# Patient Record
Sex: Male | Born: 1960 | Hispanic: No | Marital: Married | Smoking: Former smoker
Health system: Southern US, Community
[De-identification: ages and names within clinical notes are randomized; demographics above are authoritative.]

## PROBLEM LIST (undated history)

## (undated) HISTORY — PX: KNEE SURGERY: SHX244

## (undated) HISTORY — PX: TONSILLECTOMY: SUR1361

## (undated) HISTORY — PX: CHOLECYSTECTOMY: SHX55

---

## 1998-03-26 ENCOUNTER — Emergency Department (HOSPITAL_COMMUNITY): Admission: EM | Admit: 1998-03-26 | Discharge: 1998-03-26 | Payer: Self-pay | Admitting: Emergency Medicine

## 2005-11-27 ENCOUNTER — Emergency Department (HOSPITAL_COMMUNITY): Admission: EM | Admit: 2005-11-27 | Discharge: 2005-11-27 | Payer: Self-pay | Admitting: Emergency Medicine

## 2006-01-07 ENCOUNTER — Ambulatory Visit (HOSPITAL_COMMUNITY): Admission: RE | Admit: 2006-01-07 | Discharge: 2006-01-07 | Payer: Self-pay | Admitting: Surgery

## 2010-12-28 ENCOUNTER — Emergency Department (HOSPITAL_COMMUNITY)
Admission: EM | Admit: 2010-12-28 | Discharge: 2010-12-28 | Disposition: A | Discharge status: Home / Self Care | Payer: BC Managed Care – PPO | Attending: Emergency Medicine | Admitting: Emergency Medicine

## 2010-12-28 ENCOUNTER — Emergency Department (HOSPITAL_COMMUNITY): Payer: BC Managed Care – PPO

## 2010-12-28 DIAGNOSIS — S61409A Unspecified open wound of unspecified hand, initial encounter: Secondary | ICD-10-CM | POA: Insufficient documentation

## 2010-12-28 DIAGNOSIS — W261XXA Contact with sword or dagger, initial encounter: Secondary | ICD-10-CM | POA: Insufficient documentation

## 2010-12-28 DIAGNOSIS — IMO0002 Reserved for concepts with insufficient information to code with codable children: Secondary | ICD-10-CM

## 2010-12-28 DIAGNOSIS — W260XXA Contact with knife, initial encounter: Secondary | ICD-10-CM | POA: Insufficient documentation

## 2010-12-28 MED ORDER — DOXYCYCLINE HYCLATE 100 MG PO CAPS: 100.0000 mg | ORAL_CAPSULE | Freq: Two times a day (BID) | ORAL | Status: AC

## 2010-12-28 MED ORDER — LIDOCAINE HCL 2 % IJ SOLN
INTRAMUSCULAR | Status: AC
  Administered 2010-12-28: 18:00:00
  Filled 2010-12-28: qty 1

## 2010-12-28 MED ORDER — OXYCODONE-ACETAMINOPHEN 5-325 MG PO TABS: 1.0000 | ORAL_TABLET | Freq: Four times a day (QID) | ORAL | Status: AC | PRN

## 2010-12-28 MED ORDER — LIDOCAINE HCL (PF) 1 % IJ SOLN
5.0000 mL | Freq: Once | INTRAMUSCULAR | Status: DC
  Filled 2010-12-28: qty 5

## 2010-12-28 NOTE — ED Provider Notes (Signed)
Medical screening examination/treatment/procedure(s) were performed by non-physician practitioner and as supervising physician I was immediately available for consultation/collaboration.   Joya Gaskins, MD 12/28/10 [phone]

## 2010-12-28 NOTE — ED Notes (Signed)
Last tetnus shot 2 years ago

## 2010-12-28 NOTE — ED Notes (Signed)
Pt reports cut left hand middle finger with table saw- cap refill <2 sec.  Pt with good CMS

## 2010-12-28 NOTE — ED Provider Notes (Signed)
History     CSN: [id number] Arrival date & time: 12/28/2010  3:55 PM   First MD Initiated Contact with Patient 12/28/10 1641      Chief Complaint  Patient presents with  . Laceration    (Consider location/radiation/quality/duration/timing/severity/associated sxs/prior treatment) Patient is a 50 y.o. male presenting with skin laceration. The history is provided by the patient.  Laceration  The incident occurred 3 to 5 hours ago. The laceration is located on the left hand. The laceration is 1 cm in size. The laceration mechanism was a a dirty knife. The pain is at a severity of 7/10. The pain is moderate. The pain has been constant since onset. He reports no foreign bodies present. His tetanus status is UTD.    Pt was cutting with a table saw and injured his finger on the left hand.   History reviewed. No pertinent past medical history.  History reviewed. No pertinent past surgical history.  History reviewed. No pertinent family history.  History  Substance Use Topics  . Smoking status: Former Games developer  . Smokeless tobacco: Not on file  . Alcohol Use: Yes      Review of Systems  All other systems reviewed and are negative.    Allergies  Review of patient's allergies indicates no known allergies.  Home Medications  No current outpatient prescriptions on file.  BP 150/94  Pulse 70  Temp(Src) 97.9 F (36.6 C) (Oral)  Resp 20  SpO2 97%  Physical Exam  Constitutional: He is oriented to person, place, and time. He appears well-developed and well-nourished.  HENT:  Head: Normocephalic and atraumatic.  Eyes: EOM are normal. Pupils are equal, round, and reactive to light.  Neck: Normal range of motion.  Cardiovascular: Normal rate and regular rhythm.   Pulmonary/Chest: Effort normal and breath sounds normal.  Musculoskeletal: Normal range of motion.       Hands: Neurological: He is alert and oriented to person, place, and time.  Skin: Skin is warm and dry.     ED Course  LACERATION REPAIR Date/Time: 12/28/2010 6:30 PM Performed by: Dorthula Matas Authorized by: Dorthula Matas Consent: Verbal consent obtained. Consent given by: patient Patient understanding: patient states understanding of the procedure being performed Patient consent: the patient's understanding of the procedure matches consent given Procedure consent: procedure consent matches procedure scheduled Patient identity confirmed: verbally with patient Body area: upper extremity Location details: left long finger Laceration length: 1.5 cm Foreign bodies: metal Tendon involvement: none Nerve involvement: none Vascular damage: no Anesthesia: local infiltration Patient sedated: no Irrigation solution: pt soaked finger in iodine for 30 minutes. Skin closure: 6-0 Prolene Number of sutures: 2 Technique: simple Approximation: close Approximation difficulty: complex Dressing: antibiotic ointment Patient tolerance: Patient tolerated the procedure well with no immediate complications.   (including critical care time)  Labs Reviewed - No data to display No results found.   No diagnosis found.    MDM          Dorthula Matas, PA 12/28/10 [phone]

## 2013-05-10 IMAGING — CR DG FINGER MIDDLE 2+V*L*
3 series · 3 of 3 positions shown · non-contrast
Comparison: None.

CLINICAL DATA: Laceration to tip of finger.

LEFT MIDDLE FINGER 2+V

[x finger pa left]
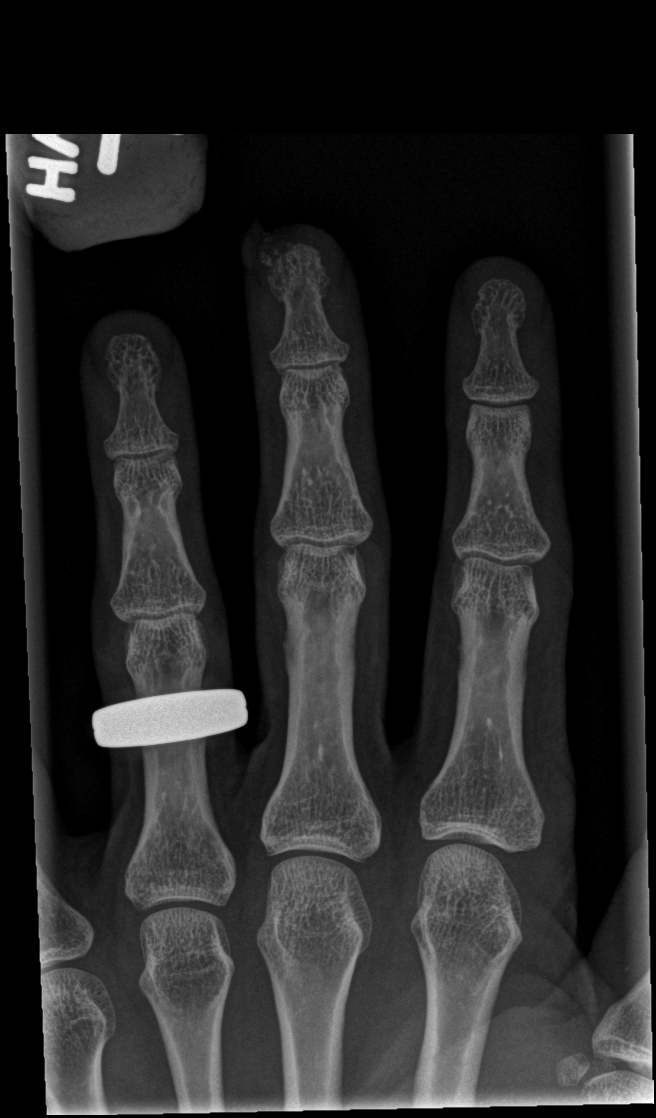

[x finger obl left]
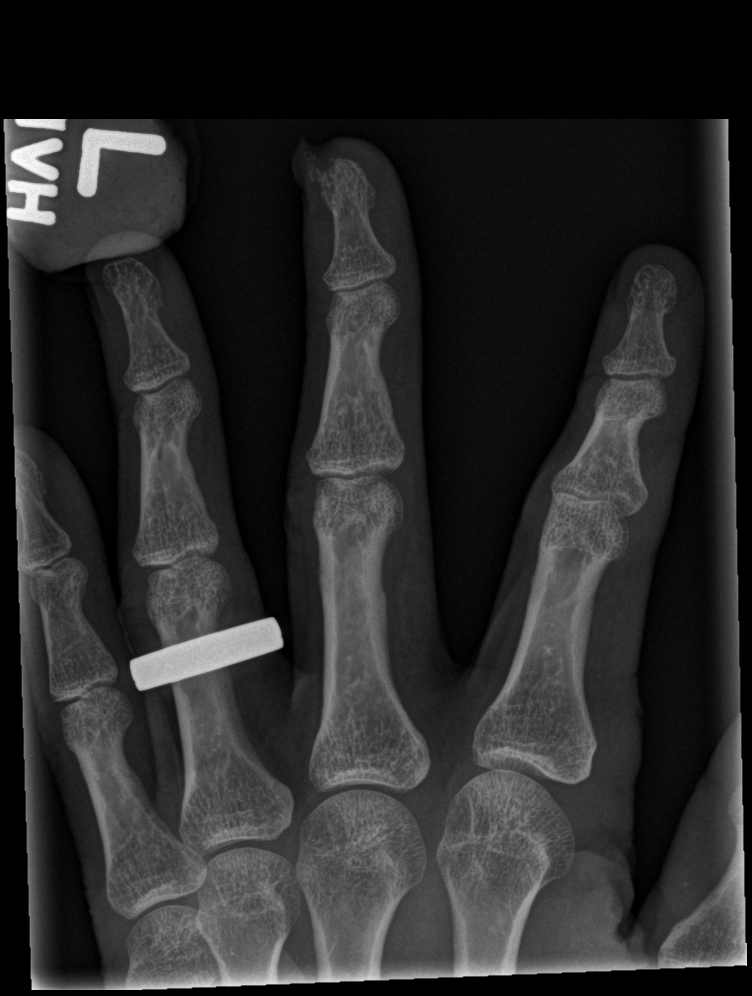

[x finger lat left]
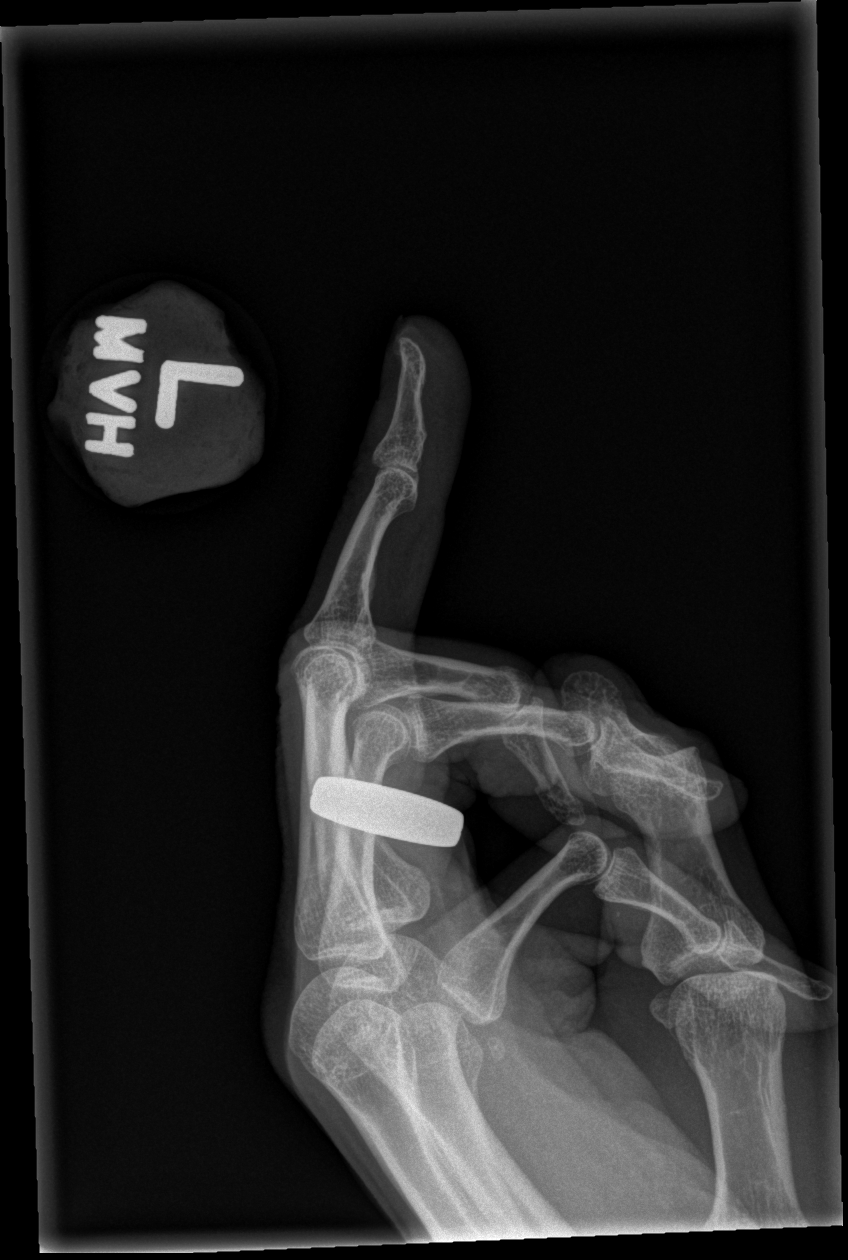

[3 of 3 positions shown; findings below may reference images not displayed]

FINDINGS: Tuft fracture/partial amputation of the tip of the third
digit.  No intra-articular extension. No radio-opaque foreign body.
IMPRESSION: Tuft fracture/partial amputation of the tip of the third digit.

## 2014-10-18 ENCOUNTER — Emergency Department (HOSPITAL_COMMUNITY)
Admission: EM | Admit: 2014-10-18 | Discharge: 2014-10-18 | Disposition: A | Discharge status: Home / Self Care | Payer: BLUE CROSS/BLUE SHIELD | Attending: Emergency Medicine | Admitting: Emergency Medicine

## 2014-10-18 ENCOUNTER — Encounter (HOSPITAL_COMMUNITY): Payer: Self-pay | Admitting: Emergency Medicine

## 2014-10-18 DIAGNOSIS — Y998 Other external cause status: Secondary | ICD-10-CM | POA: Diagnosis not present

## 2014-10-18 DIAGNOSIS — Y289XXA Contact with unspecified sharp object, undetermined intent, initial encounter: Secondary | ICD-10-CM | POA: Insufficient documentation

## 2014-10-18 DIAGNOSIS — S41111A Laceration without foreign body of right upper arm, initial encounter: Secondary | ICD-10-CM | POA: Insufficient documentation

## 2014-10-18 DIAGNOSIS — Y9339 Activity, other involving climbing, rappelling and jumping off: Secondary | ICD-10-CM | POA: Diagnosis not present

## 2014-10-18 DIAGNOSIS — Z87891 Personal history of nicotine dependence: Secondary | ICD-10-CM | POA: Diagnosis not present

## 2014-10-18 DIAGNOSIS — Y9289 Other specified places as the place of occurrence of the external cause: Secondary | ICD-10-CM | POA: Insufficient documentation

## 2014-10-18 DIAGNOSIS — S59911A Unspecified injury of right forearm, initial encounter: Secondary | ICD-10-CM | POA: Diagnosis present

## 2014-10-18 DIAGNOSIS — Z23 Encounter for immunization: Secondary | ICD-10-CM | POA: Insufficient documentation

## 2014-10-18 MED ORDER — TETANUS-DIPHTH-ACELL PERTUSSIS 5-2.5-18.5 LF-MCG/0.5 IM SUSP
0.5000 mL | Freq: Once | INTRAMUSCULAR | Status: AC
  Administered 2014-10-18: 0.5 mL via INTRAMUSCULAR
  Filled 2014-10-18: qty 0.5

## 2014-10-18 NOTE — ED Notes (Signed)
Pt states he was climbing ladder and cut arm on shelf brackets. Approximately 2" laceration to right forearm, bleeding controlled, clean dressing in place upon arrival.

## 2014-10-18 NOTE — ED Notes (Signed)
AVS explained in detail. [facility]. Given laceration care instructions. No other c/c.

## 2014-10-18 NOTE — Discharge Instructions (Signed)

## 2014-10-18 NOTE — ED Provider Notes (Signed)
CSN: [id number]     Arrival date & time 10/18/14  1316 History  This chart was scribed for non-physician practitioner, Everlene Farrier, PA-C working with Leta Baptist, MD by Placido Sou, ED scribe. This patient was seen in room WTR7/WTR7 and the patient's care was started at 3:21 PM.   Chief Complaint  Patient presents with  . Extremity Laceration   The history is provided by the patient. No language interpreter was used.    HPI Comments: Bernard Morales is a 54 y.o. male who presents to the Emergency Department complaining of two  lacerations with controlled bleeding to his right forearm with onset earlier today. Pt notes that he was climbing a ladder and cut his arm across a metal bracket. Pt is unsure of his last tetanus vaccination. He denies any numbness, tingling or weakness.   History reviewed. No pertinent past medical history. Past Surgical History  Procedure Laterality Date  . Knee surgery    . Cholecystectomy    . Tonsillectomy     No family history on file. Social History  Substance Use Topics  . Smoking status: Former Games developer  . Smokeless tobacco: None  . Alcohol Use: Yes    Review of Systems  Constitutional: Negative for fever.  Skin: Positive for wound. Negative for color change.  Neurological: Negative for weakness and numbness.   Allergies  Review of patient's allergies indicates no known allergies.  Home Medications   Prior to Admission medications   Not on File   BP 120/78 mmHg  Pulse 63  Temp(Src) 98.5 F (36.9 C) (Oral)  Resp 18  Ht 6\' 1"  (1.854 m)  Wt 152 lb (68.947 kg)  BMI 20.06 kg/m2  SpO2 96% Physical Exam  Constitutional: He appears well-developed and well-nourished. No distress.  HENT:  Head: Normocephalic and atraumatic.  Eyes: Right eye exhibits no discharge. Left eye exhibits no discharge.  Pulmonary/Chest: Effort normal. No respiratory distress.  Musculoskeletal: Normal range of motion. He exhibits no edema or tenderness.   Good strength and range of motion of his right hand and wrist. No forearm tenderness.  Neurological: He is alert. Coordination normal.  Skin: Skin is warm and dry. No rash noted. He is not diaphoretic. No erythema. No pallor.  1.5 cm laceration to right forearm and 1 cm laceration distal to first laceration; both are superficial dermal layer only with no tendon involvement.   Psychiatric: He has a normal mood and affect. His behavior is normal.  Nursing note and vitals reviewed.  ED Course  LACERATION REPAIR Date/Time: 10/18/2014 3:30 PM Performed by: Everlene Farrier Authorized by: Everlene Farrier Consent: Verbal consent obtained. Risks and benefits: risks, benefits and alternatives were discussed Consent given by: patient Patient understanding: patient states understanding of the procedure being performed Patient consent: the patient's understanding of the procedure matches consent given Procedure consent: procedure consent matches procedure scheduled Test results: test results available and properly labeled Site marked: the operative site was marked Required items: required blood products, implants, devices, and special equipment available Patient identity confirmed: verbally with patient Time out: Immediately prior to procedure a "time out" was called to verify the correct patient, procedure, equipment, support staff and site/side marked as required. Body area: upper extremity Location details: right lower arm Laceration length: 1.5 cm Foreign bodies: no foreign bodies Tendon involvement: none Nerve involvement: none Vascular damage: no Patient sedated: no Preparation: Patient was prepped and draped in the usual sterile fashion. Irrigation solution: saline Irrigation method: jet lavage Amount  of cleaning: standard Debridement: none Degree of undermining: none Skin closure: glue Technique: simple Approximation: close Approximation difficulty: simple Dressing: non-adhesive  packing strip Patient tolerance: Patient tolerated the procedure well with no immediate complications    DIAGNOSTIC STUDIES: Oxygen Saturation is 96% on RA, adequate by my interpretation.    COORDINATION OF CARE: 3:25 PM Discussed treatment plan with pt at bedside including a tdap booster, cleaning the affected wound and closing the lacerations with Dermabond and steri strips. Pt agreed to plan.  Labs Review Labs Reviewed - No data to display  Imaging Review No results found.    EKG Interpretation None      Filed Vitals:   10/18/14 1336 10/18/14 1338  BP: 120/78   Pulse: 63   Temp: 98.5 F (36.9 C)   TempSrc: Oral   Resp: 18   Height:   (1.854 m)  Weight:  152 lb (68.947 kg)  SpO2: 96%      MDM   Meds given in ED:  Medications  Tdap (BOOSTRIX) injection 0.5 mL (not administered)    New Prescriptions   No medications on file    Final diagnoses:  Arm laceration, right, initial encounter   This 54 year old male who presents to the emergency department with a superficial laceration to his right forearm. He denies any numbness or tingling.He denies any arm weakness. The patient has a 1.5 cm superficial dermal layer laceration to his right forearm and a 1 cm laceration distal to the first laceration. Both are well approximated. No evidence of foreign bodies. Bleeding is controlled. No evidence of tendon involvement. The patient has good strength and range of motion of his right hand and wrist. I discussed with the patient whether or not to use Dermabond or suturing. The patient elected for Dermabond closure with Steri-Strips. I agree with this plan. Patient's lacerations were cleaned by me and repaired with Dermabond and Steri-Strips. The patient tolerated procedure well. Wound care instructions given. I advised the patient to follow-up with their primary care provider this week. I advised the patient to return to the emergency department with new or worsening symptoms  or new concerns. The patient verbalized understanding and agreement with plan.    I personally performed the services described in this documentation, which was scribed in my presence. The recorded information has been reviewed and is accurate.      Everlene Farrier, PA-C 10/18/14 1544  Leta Baptist, MD 10/20/14 [phone]

## 2015-12-06 DIAGNOSIS — M0579 Rheumatoid arthritis with rheumatoid factor of multiple sites without organ or systems involvement: Secondary | ICD-10-CM | POA: Diagnosis not present

## 2016-06-04 DIAGNOSIS — Z79899 Other long term (current) drug therapy: Secondary | ICD-10-CM | POA: Diagnosis not present

## 2016-06-04 DIAGNOSIS — Z6823 Body mass index (BMI) 23.0-23.9, adult: Secondary | ICD-10-CM | POA: Diagnosis not present

## 2016-06-04 DIAGNOSIS — M0579 Rheumatoid arthritis with rheumatoid factor of multiple sites without organ or systems involvement: Secondary | ICD-10-CM | POA: Diagnosis not present

## 2016-06-04 DIAGNOSIS — M2550 Pain in unspecified joint: Secondary | ICD-10-CM | POA: Diagnosis not present

## 2016-06-13 DIAGNOSIS — M069 Rheumatoid arthritis, unspecified: Secondary | ICD-10-CM | POA: Diagnosis not present

## 2016-06-13 DIAGNOSIS — F419 Anxiety disorder, unspecified: Secondary | ICD-10-CM | POA: Diagnosis not present

## 2016-07-15 DIAGNOSIS — E7800 Pure hypercholesterolemia, unspecified: Secondary | ICD-10-CM | POA: Diagnosis not present

## 2016-07-15 DIAGNOSIS — Z0000 Encounter for general adult medical examination without abnormal findings: Secondary | ICD-10-CM | POA: Diagnosis not present

## 2016-07-15 DIAGNOSIS — S46812A Strain of other muscles, fascia and tendons at shoulder and upper arm level, left arm, initial encounter: Secondary | ICD-10-CM | POA: Diagnosis not present

## 2016-12-04 DIAGNOSIS — Z79899 Other long term (current) drug therapy: Secondary | ICD-10-CM | POA: Diagnosis not present

## 2016-12-04 DIAGNOSIS — M0579 Rheumatoid arthritis with rheumatoid factor of multiple sites without organ or systems involvement: Secondary | ICD-10-CM | POA: Diagnosis not present

## 2016-12-04 DIAGNOSIS — M2550 Pain in unspecified joint: Secondary | ICD-10-CM | POA: Diagnosis not present

## 2016-12-04 DIAGNOSIS — Z23 Encounter for immunization: Secondary | ICD-10-CM | POA: Diagnosis not present

## 2017-06-04 DIAGNOSIS — M0579 Rheumatoid arthritis with rheumatoid factor of multiple sites without organ or systems involvement: Secondary | ICD-10-CM | POA: Diagnosis not present

## 2017-06-04 DIAGNOSIS — Z79899 Other long term (current) drug therapy: Secondary | ICD-10-CM | POA: Diagnosis not present

## 2017-06-04 DIAGNOSIS — M2550 Pain in unspecified joint: Secondary | ICD-10-CM | POA: Diagnosis not present

## 2017-11-03 DIAGNOSIS — F419 Anxiety disorder, unspecified: Secondary | ICD-10-CM | POA: Diagnosis not present

## 2017-11-03 DIAGNOSIS — Z23 Encounter for immunization: Secondary | ICD-10-CM | POA: Diagnosis not present

## 2017-12-10 DIAGNOSIS — Z79899 Other long term (current) drug therapy: Secondary | ICD-10-CM | POA: Diagnosis not present

## 2017-12-10 DIAGNOSIS — M2550 Pain in unspecified joint: Secondary | ICD-10-CM | POA: Diagnosis not present

## 2017-12-10 DIAGNOSIS — M0579 Rheumatoid arthritis with rheumatoid factor of multiple sites without organ or systems involvement: Secondary | ICD-10-CM | POA: Diagnosis not present

## 2018-06-10 DIAGNOSIS — M0579 Rheumatoid arthritis with rheumatoid factor of multiple sites without organ or systems involvement: Secondary | ICD-10-CM | POA: Diagnosis not present

## 2018-06-10 DIAGNOSIS — Z79899 Other long term (current) drug therapy: Secondary | ICD-10-CM | POA: Diagnosis not present

## 2018-12-10 DIAGNOSIS — M0579 Rheumatoid arthritis with rheumatoid factor of multiple sites without organ or systems involvement: Secondary | ICD-10-CM | POA: Diagnosis not present

## 2018-12-10 DIAGNOSIS — M2550 Pain in unspecified joint: Secondary | ICD-10-CM | POA: Diagnosis not present

## 2018-12-10 DIAGNOSIS — Z23 Encounter for immunization: Secondary | ICD-10-CM | POA: Diagnosis not present

## 2019-05-26 DIAGNOSIS — M76829 Posterior tibial tendinitis, unspecified leg: Secondary | ICD-10-CM | POA: Diagnosis not present

## 2019-05-26 DIAGNOSIS — M0579 Rheumatoid arthritis with rheumatoid factor of multiple sites without organ or systems involvement: Secondary | ICD-10-CM | POA: Diagnosis not present

## 2019-05-26 DIAGNOSIS — M2550 Pain in unspecified joint: Secondary | ICD-10-CM | POA: Diagnosis not present

## 2019-05-26 DIAGNOSIS — Z79899 Other long term (current) drug therapy: Secondary | ICD-10-CM | POA: Diagnosis not present

## 2019-05-30 DIAGNOSIS — Z03818 Encounter for observation for suspected exposure to other biological agents ruled out: Secondary | ICD-10-CM | POA: Diagnosis not present

## 2019-05-30 DIAGNOSIS — Z20828 Contact with and (suspected) exposure to other viral communicable diseases: Secondary | ICD-10-CM | POA: Diagnosis not present

## 2019-08-01 DIAGNOSIS — S20219A Contusion of unspecified front wall of thorax, initial encounter: Secondary | ICD-10-CM | POA: Diagnosis not present

## 2019-11-25 DIAGNOSIS — M0579 Rheumatoid arthritis with rheumatoid factor of multiple sites without organ or systems involvement: Secondary | ICD-10-CM | POA: Diagnosis not present

## 2019-11-25 DIAGNOSIS — M2550 Pain in unspecified joint: Secondary | ICD-10-CM | POA: Diagnosis not present

## 2019-11-25 DIAGNOSIS — Z79899 Other long term (current) drug therapy: Secondary | ICD-10-CM | POA: Diagnosis not present

## 2019-11-25 DIAGNOSIS — Z111 Encounter for screening for respiratory tuberculosis: Secondary | ICD-10-CM | POA: Diagnosis not present

## 2020-01-19 DIAGNOSIS — R509 Fever, unspecified: Secondary | ICD-10-CM | POA: Diagnosis not present

## 2020-01-19 DIAGNOSIS — M545 Low back pain, unspecified: Secondary | ICD-10-CM | POA: Diagnosis not present

## 2020-01-24 DIAGNOSIS — N390 Urinary tract infection, site not specified: Secondary | ICD-10-CM | POA: Diagnosis not present

## 2020-02-01 DIAGNOSIS — L821 Other seborrheic keratosis: Secondary | ICD-10-CM | POA: Diagnosis not present

## 2020-02-01 DIAGNOSIS — D225 Melanocytic nevi of trunk: Secondary | ICD-10-CM | POA: Diagnosis not present

## 2020-02-01 DIAGNOSIS — L814 Other melanin hyperpigmentation: Secondary | ICD-10-CM | POA: Diagnosis not present

## 2020-02-01 DIAGNOSIS — D1801 Hemangioma of skin and subcutaneous tissue: Secondary | ICD-10-CM | POA: Diagnosis not present

## 2020-05-29 DIAGNOSIS — Z79899 Other long term (current) drug therapy: Secondary | ICD-10-CM | POA: Diagnosis not present

## 2020-05-29 DIAGNOSIS — M2550 Pain in unspecified joint: Secondary | ICD-10-CM | POA: Diagnosis not present

## 2020-05-29 DIAGNOSIS — M0579 Rheumatoid arthritis with rheumatoid factor of multiple sites without organ or systems involvement: Secondary | ICD-10-CM | POA: Diagnosis not present

## 2020-10-31 ENCOUNTER — Other Ambulatory Visit: Payer: Self-pay | Admitting: *Deleted

## 2020-10-31 ENCOUNTER — Other Ambulatory Visit: Payer: Self-pay

## 2020-10-31 DIAGNOSIS — Z125 Encounter for screening for malignant neoplasm of prostate: Secondary | ICD-10-CM

## 2020-10-31 NOTE — Progress Notes (Signed)
Patient: Bernard Morales           Date of Birth: [birth year]           MRN: [id number] Visit Date: 10/31/2020 PCP: No primary care provider on file.  Prostate Cancer Screening Date of last physical exam:  (2020) Date of last rectal exam:  (Never) Have you ever had any of the following?: Vasectomy, Family member with prostate cancer (Family History of father having prostate cancer) Have you ever had or been told you have an allergy to latex products?: No Are you currently taking any natural prostate preparations?: No Are you currently experiencing any urinary symptoms?: No  Prostate Exam Exam not completed. PSA Only.  Patient's History There are no problems to display for this patient.  No past medical history on file.  No family history on file.  Social History   Occupational History   Not on file  Tobacco Use   Smoking status: Former   Smokeless tobacco: Not on file  Substance and Sexual Activity   Alcohol use: Yes   Drug use: No   Sexual activity: Not on file

## 2020-11-01 LAB — PSA: Prostate Specific Ag, Serum: 2.8 ng/mL (ref 0.0–4.0)

## 2020-11-12 ENCOUNTER — Telehealth: Payer: Self-pay

## 2020-11-12 NOTE — Telephone Encounter (Signed)
Normal PSA result letter mailed.  November 12, 2020         Dear Mr. Bernard Morales,   Thank you for your participation in the Prostate Cancer Screening at [facility] on October 31, 2020.   The result of your blood test for the level of the Prostate Specific Antigen (PSA) was found to be normal. It is recommended that continue yearly screening and share these results with your physician.  If you have any questions about these results, please call Fonnie Mu at [phone] or Geraldin Habermehl at [phone].  Sincerely,     Fonnie Mu, MSN, RN,OCN Oncology Outreach Manager [facility]   Clois Dupes, LPN Oncology Outreach  [facility]

## 2020-12-04 DIAGNOSIS — M0579 Rheumatoid arthritis with rheumatoid factor of multiple sites without organ or systems involvement: Secondary | ICD-10-CM | POA: Diagnosis not present

## 2020-12-04 DIAGNOSIS — Z79899 Other long term (current) drug therapy: Secondary | ICD-10-CM | POA: Diagnosis not present

## 2020-12-04 DIAGNOSIS — R5383 Other fatigue: Secondary | ICD-10-CM | POA: Diagnosis not present

## 2020-12-04 DIAGNOSIS — M25562 Pain in left knee: Secondary | ICD-10-CM | POA: Diagnosis not present

## 2021-06-10 DIAGNOSIS — Z79899 Other long term (current) drug therapy: Secondary | ICD-10-CM | POA: Diagnosis not present

## 2021-06-10 DIAGNOSIS — M0579 Rheumatoid arthritis with rheumatoid factor of multiple sites without organ or systems involvement: Secondary | ICD-10-CM | POA: Diagnosis not present

## 2021-08-13 ENCOUNTER — Encounter (HOSPITAL_COMMUNITY): Payer: Self-pay

## 2021-08-13 ENCOUNTER — Other Ambulatory Visit: Payer: Self-pay

## 2021-08-13 ENCOUNTER — Emergency Department (HOSPITAL_COMMUNITY): Payer: BC Managed Care – PPO

## 2021-08-13 ENCOUNTER — Emergency Department (HOSPITAL_COMMUNITY)
Admission: EM | Admit: 2021-08-13 | Discharge: 2021-08-13 | Discharge status: Against Medical Advice | Payer: BC Managed Care – PPO | Attending: Emergency Medicine | Admitting: Emergency Medicine

## 2021-08-13 DIAGNOSIS — M25512 Pain in left shoulder: Secondary | ICD-10-CM | POA: Insufficient documentation

## 2021-08-13 DIAGNOSIS — R200 Anesthesia of skin: Secondary | ICD-10-CM | POA: Diagnosis not present

## 2021-08-13 DIAGNOSIS — M25519 Pain in unspecified shoulder: Secondary | ICD-10-CM | POA: Diagnosis not present

## 2021-08-13 DIAGNOSIS — Z5321 Procedure and treatment not carried out due to patient leaving prior to being seen by health care provider: Secondary | ICD-10-CM | POA: Insufficient documentation

## 2021-08-13 DIAGNOSIS — R0789 Other chest pain: Secondary | ICD-10-CM | POA: Diagnosis not present

## 2021-08-13 DIAGNOSIS — R202 Paresthesia of skin: Secondary | ICD-10-CM | POA: Insufficient documentation

## 2021-08-13 DIAGNOSIS — R079 Chest pain, unspecified: Secondary | ICD-10-CM | POA: Insufficient documentation

## 2021-08-13 DIAGNOSIS — F17290 Nicotine dependence, other tobacco product, uncomplicated: Secondary | ICD-10-CM | POA: Diagnosis not present

## 2021-08-13 LAB — BASIC METABOLIC PANEL
Anion gap: 13 (ref 5–15)
BUN: 13 mg/dL (ref 8–23)
CO2: 22 mmol/L (ref 22–32)
Calcium: 9.3 mg/dL (ref 8.9–10.3)
Chloride: 102 mmol/L (ref 98–111)
Creatinine, Ser: 0.79 mg/dL (ref 0.61–1.24)
GFR, Estimated: >60 mL/min (ref >60)
Glucose, Bld: 91 mg/dL (ref 70–99)
Potassium: 4 mmol/L (ref 3.5–5.1)
Sodium: 137 mmol/L (ref 135–145)

## 2021-08-13 LAB — CBC WITH DIFFERENTIAL/PLATELET
Abs Immature Granulocytes: 0.03 10*3/uL (ref 0.00–0.07)
Basophils Absolute: 0.1 10*3/uL (ref 0.0–0.1)
Basophils Relative: 1 %
Eosinophils Absolute: 0.3 10*3/uL (ref 0.0–0.5)
Eosinophils Relative: 4 %
HCT: 47.6 % (ref 39.0–52.0)
Hemoglobin: 16.2 g/dL (ref 13.0–17.0)
Immature Granulocytes: 0 %
Lymphocytes Relative: 33 %
Lymphs Abs: 2.4 10*3/uL (ref 0.7–4.0)
MCH: 31.9 pg (ref 26.0–34.0)
MCHC: 34 g/dL (ref 30.0–36.0)
MCV: 93.7 fL (ref 80.0–100.0)
Monocytes Absolute: 0.7 10*3/uL (ref 0.1–1.0)
Monocytes Relative: 10 %
Neutro Abs: 3.8 10*3/uL (ref 1.7–7.7)
Neutrophils Relative %: 52 %
Platelets: 260 10*3/uL (ref 150–400)
RBC: 5.08 MIL/uL (ref 4.22–5.81)
RDW: 12 % (ref 11.5–15.5)
WBC: 7.2 10*3/uL (ref 4.0–10.5)
nRBC: 0 % (ref 0.0–0.2)

## 2021-08-13 LAB — TROPONIN I (HIGH SENSITIVITY): Troponin I (High Sensitivity): 4 ng/L (ref <18)

## 2021-08-13 NOTE — ED Notes (Signed)
Registration told this tech that patient left  

## 2021-08-13 NOTE — ED Provider Triage Note (Signed)
Emergency Medicine Provider Triage Evaluation Note  Bernard Morales , a 61 y.o. male  was evaluated in triage.  Pt complains of left-sided chest pain and left back pain radiating down into the left arm.  Patient states that starting Saturday, 4 days ago, he would wake up in the morning with pain near his left shoulder blade.  It would improve throughout the day and would come and go.  Since the last day or so, he has now experienced pain described as pressure on the left side of his chest with numbness and tingling of his left arm.  Patient is a non-smoker although occasionally smokes a cigar and he drinks a couple of beers a day.  No previous cardiac history.  Review of Systems  Positive: Chest pain, numbness, tingling Negative: Diaphoresis, abdominal pain, nausea, vomiting, diarrhea  Physical Exam  BP (!) 172/108 (BP Location: Left Arm)   Pulse (!) 58   Temp 97.8 F (36.6 C) (Oral)   Resp 16   Ht 6\' 1"  (1.854 m)   Wt 72.6 kg   SpO2 97%   BMI 21.11 kg/m  Gen:   Awake, no distress   Resp:  Normal effort  MSK:   Moves extremities without difficulty  Other:    Medical Decision Making  Medically screening exam initiated at 12:37 PM.  Appropriate orders placed.  Bernard Morales was informed that the remainder of the evaluation will be completed by another provider, this initial triage assessment does not replace that evaluation, and the importance of remaining in the ED until their evaluation is complete.     Bernard Morales, Bernard Morales 08/13/21 1238

## 2021-08-13 NOTE — ED Triage Notes (Signed)
Pt arrived POV from home c/o left shoulder pain that started on Saturday then this morning he woke up and the pain wrapped around to the front and into his chest and then around 9am it started feeling numb. Pt's LKW was 4am.

## 2021-08-22 DIAGNOSIS — M5412 Radiculopathy, cervical region: Secondary | ICD-10-CM | POA: Diagnosis not present

## 2021-09-23 DIAGNOSIS — Z23 Encounter for immunization: Secondary | ICD-10-CM | POA: Diagnosis not present

## 2021-09-23 DIAGNOSIS — Z125 Encounter for screening for malignant neoplasm of prostate: Secondary | ICD-10-CM | POA: Diagnosis not present

## 2021-09-23 DIAGNOSIS — M5412 Radiculopathy, cervical region: Secondary | ICD-10-CM | POA: Diagnosis not present

## 2021-09-23 DIAGNOSIS — R030 Elevated blood-pressure reading, without diagnosis of hypertension: Secondary | ICD-10-CM | POA: Diagnosis not present

## 2021-09-23 DIAGNOSIS — Z0000 Encounter for general adult medical examination without abnormal findings: Secondary | ICD-10-CM | POA: Diagnosis not present

## 2021-09-23 DIAGNOSIS — H9193 Unspecified hearing loss, bilateral: Secondary | ICD-10-CM | POA: Diagnosis not present

## 2021-09-23 DIAGNOSIS — Z1159 Encounter for screening for other viral diseases: Secondary | ICD-10-CM | POA: Diagnosis not present

## 2021-09-23 DIAGNOSIS — E7800 Pure hypercholesterolemia, unspecified: Secondary | ICD-10-CM | POA: Diagnosis not present

## 2021-10-08 DIAGNOSIS — H93293 Other abnormal auditory perceptions, bilateral: Secondary | ICD-10-CM | POA: Diagnosis not present

## 2021-12-11 DIAGNOSIS — R5383 Other fatigue: Secondary | ICD-10-CM | POA: Diagnosis not present

## 2021-12-11 DIAGNOSIS — Z79899 Other long term (current) drug therapy: Secondary | ICD-10-CM | POA: Diagnosis not present

## 2021-12-11 DIAGNOSIS — M0579 Rheumatoid arthritis with rheumatoid factor of multiple sites without organ or systems involvement: Secondary | ICD-10-CM | POA: Diagnosis not present

## 2022-09-29 DIAGNOSIS — E7800 Pure hypercholesterolemia, unspecified: Secondary | ICD-10-CM | POA: Diagnosis not present

## 2022-09-29 DIAGNOSIS — Z23 Encounter for immunization: Secondary | ICD-10-CM | POA: Diagnosis not present

## 2022-09-29 DIAGNOSIS — M1712 Unilateral primary osteoarthritis, left knee: Secondary | ICD-10-CM | POA: Diagnosis not present

## 2022-09-29 DIAGNOSIS — Z0000 Encounter for general adult medical examination without abnormal findings: Secondary | ICD-10-CM | POA: Diagnosis not present

## 2022-10-08 DIAGNOSIS — M1712 Unilateral primary osteoarthritis, left knee: Secondary | ICD-10-CM | POA: Diagnosis not present

## 2022-11-10 DIAGNOSIS — D124 Benign neoplasm of descending colon: Secondary | ICD-10-CM | POA: Diagnosis not present

## 2022-11-10 DIAGNOSIS — K648 Other hemorrhoids: Secondary | ICD-10-CM | POA: Diagnosis not present

## 2022-11-10 DIAGNOSIS — D125 Benign neoplasm of sigmoid colon: Secondary | ICD-10-CM | POA: Diagnosis not present

## 2022-11-10 DIAGNOSIS — Z1211 Encounter for screening for malignant neoplasm of colon: Secondary | ICD-10-CM | POA: Diagnosis not present

## 2022-11-12 DIAGNOSIS — M1712 Unilateral primary osteoarthritis, left knee: Secondary | ICD-10-CM | POA: Diagnosis not present

## 2023-03-05 DIAGNOSIS — M1712 Unilateral primary osteoarthritis, left knee: Secondary | ICD-10-CM | POA: Diagnosis not present

## 2023-03-09 DIAGNOSIS — Z79899 Other long term (current) drug therapy: Secondary | ICD-10-CM | POA: Diagnosis not present

## 2023-03-19 DIAGNOSIS — G8918 Other acute postprocedural pain: Secondary | ICD-10-CM | POA: Diagnosis not present

## 2023-03-19 DIAGNOSIS — M25762 Osteophyte, left knee: Secondary | ICD-10-CM | POA: Diagnosis not present

## 2023-03-19 DIAGNOSIS — M1712 Unilateral primary osteoarthritis, left knee: Secondary | ICD-10-CM | POA: Diagnosis not present

## 2023-03-23 DIAGNOSIS — M25562 Pain in left knee: Secondary | ICD-10-CM | POA: Diagnosis not present

## 2023-03-27 DIAGNOSIS — M25562 Pain in left knee: Secondary | ICD-10-CM | POA: Diagnosis not present

## 2023-04-01 DIAGNOSIS — M25562 Pain in left knee: Secondary | ICD-10-CM | POA: Diagnosis not present

## 2023-04-06 DIAGNOSIS — M25562 Pain in left knee: Secondary | ICD-10-CM | POA: Diagnosis not present

## 2023-04-27 DIAGNOSIS — Z96652 Presence of left artificial knee joint: Secondary | ICD-10-CM | POA: Diagnosis not present

## 2023-10-07 DIAGNOSIS — Z23 Encounter for immunization: Secondary | ICD-10-CM | POA: Diagnosis not present

## 2023-10-07 DIAGNOSIS — R195 Other fecal abnormalities: Secondary | ICD-10-CM | POA: Diagnosis not present

## 2023-10-07 DIAGNOSIS — E7800 Pure hypercholesterolemia, unspecified: Secondary | ICD-10-CM | POA: Diagnosis not present

## 2023-10-07 DIAGNOSIS — Z0000 Encounter for general adult medical examination without abnormal findings: Secondary | ICD-10-CM | POA: Diagnosis not present

## 2023-11-05 DIAGNOSIS — K921 Melena: Secondary | ICD-10-CM | POA: Diagnosis not present

## 2023-11-05 DIAGNOSIS — R197 Diarrhea, unspecified: Secondary | ICD-10-CM | POA: Diagnosis not present

## 2023-11-09 DIAGNOSIS — R197 Diarrhea, unspecified: Secondary | ICD-10-CM | POA: Diagnosis not present

## 2023-11-09 DIAGNOSIS — K921 Melena: Secondary | ICD-10-CM | POA: Diagnosis not present

## 2023-12-22 DIAGNOSIS — R197 Diarrhea, unspecified: Secondary | ICD-10-CM | POA: Diagnosis not present
